# Patient Record
Sex: Female | Born: 1985 | Race: Black or African American | Hispanic: No | Marital: Married | Smoking: Never smoker
Health system: Southern US, Community
[De-identification: ages and names within clinical notes are randomized; demographics above are authoritative.]

## PROBLEM LIST (undated history)

## (undated) DIAGNOSIS — Z789 Other specified health status: Secondary | ICD-10-CM

## (undated) HISTORY — PX: NO PAST SURGERIES: SHX2092

---
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
## 2003-10-01 ENCOUNTER — Emergency Department (HOSPITAL_COMMUNITY): Admission: EM | Admit: 2003-10-01 | Discharge: 2003-10-01 | Payer: Self-pay | Admitting: Emergency Medicine

## 2003-10-12 ENCOUNTER — Inpatient Hospital Stay (HOSPITAL_COMMUNITY): Admission: AD | Admit: 2003-10-12 | Discharge: 2003-10-12 | Payer: Self-pay | Admitting: *Deleted

## 2004-04-12 ENCOUNTER — Inpatient Hospital Stay (HOSPITAL_COMMUNITY): Admission: AD | Admit: 2004-04-12 | Discharge: 2004-04-13 | Payer: Self-pay | Admitting: Obstetrics and Gynecology

## 2004-06-28 ENCOUNTER — Ambulatory Visit (HOSPITAL_COMMUNITY): Admission: RE | Admit: 2004-06-28 | Discharge: 2004-06-28 | Payer: Self-pay | Admitting: Cardiology

## 2004-09-25 ENCOUNTER — Inpatient Hospital Stay (HOSPITAL_COMMUNITY): Admission: AD | Admit: 2004-09-25 | Discharge: 2004-09-25 | Payer: Self-pay | Admitting: *Deleted

## 2004-11-13 ENCOUNTER — Inpatient Hospital Stay (HOSPITAL_COMMUNITY): Admission: AD | Admit: 2004-11-13 | Discharge: 2004-11-13 | Payer: Self-pay | Admitting: Obstetrics

## 2004-11-14 ENCOUNTER — Inpatient Hospital Stay (HOSPITAL_COMMUNITY): Admission: AD | Admit: 2004-11-14 | Discharge: 2004-11-15 | Payer: Self-pay | Admitting: Obstetrics

## 2004-11-22 ENCOUNTER — Inpatient Hospital Stay (HOSPITAL_COMMUNITY): Admission: AD | Admit: 2004-11-22 | Discharge: 2004-11-23 | Payer: Self-pay | Admitting: Obstetrics

## 2004-12-01 ENCOUNTER — Inpatient Hospital Stay (HOSPITAL_COMMUNITY): Admission: AD | Admit: 2004-12-01 | Discharge: 2004-12-01 | Payer: Self-pay | Admitting: Obstetrics

## 2004-12-05 ENCOUNTER — Inpatient Hospital Stay (HOSPITAL_COMMUNITY): Admission: AD | Admit: 2004-12-05 | Discharge: 2004-12-05 | Payer: Self-pay | Admitting: Obstetrics

## 2004-12-08 ENCOUNTER — Inpatient Hospital Stay (HOSPITAL_COMMUNITY): Admission: AD | Admit: 2004-12-08 | Discharge: 2004-12-11 | Payer: Self-pay | Admitting: Obstetrics

## 2004-12-08 ENCOUNTER — Inpatient Hospital Stay (HOSPITAL_COMMUNITY): Admission: AD | Admit: 2004-12-08 | Discharge: 2004-12-08 | Payer: Self-pay | Admitting: Obstetrics

## 2005-12-16 ENCOUNTER — Emergency Department (HOSPITAL_COMMUNITY): Admission: EM | Admit: 2005-12-16 | Discharge: 2005-12-16 | Payer: Self-pay | Admitting: Emergency Medicine

## 2006-11-08 ENCOUNTER — Ambulatory Visit: Payer: Self-pay | Admitting: Family Medicine

## 2006-11-11 ENCOUNTER — Ambulatory Visit: Payer: Self-pay | Admitting: *Deleted

## 2007-02-17 ENCOUNTER — Emergency Department (HOSPITAL_COMMUNITY): Admission: EM | Admit: 2007-02-17 | Discharge: 2007-02-17 | Payer: Self-pay | Admitting: Family Medicine

## 2007-03-13 ENCOUNTER — Emergency Department (HOSPITAL_COMMUNITY): Admission: EM | Admit: 2007-03-13 | Discharge: 2007-03-13 | Payer: Self-pay | Admitting: Emergency Medicine

## 2007-03-26 ENCOUNTER — Ambulatory Visit: Payer: Self-pay | Admitting: Internal Medicine

## 2007-04-14 ENCOUNTER — Ambulatory Visit (HOSPITAL_COMMUNITY): Admission: RE | Admit: 2007-04-14 | Discharge: 2007-04-14 | Payer: Self-pay | Admitting: Internal Medicine

## 2007-05-26 ENCOUNTER — Emergency Department (HOSPITAL_COMMUNITY): Admission: EM | Admit: 2007-05-26 | Discharge: 2007-05-26 | Payer: Self-pay | Admitting: Emergency Medicine

## 2007-05-30 ENCOUNTER — Ambulatory Visit (HOSPITAL_COMMUNITY): Admission: RE | Admit: 2007-05-30 | Discharge: 2007-05-30 | Payer: Self-pay | Admitting: Emergency Medicine

## 2008-01-11 IMAGING — US US ABDOMEN COMPLETE
1 series · 14 of 25 positions shown · non-contrast
Comparison: 09/15/2001

CLINICAL DATA: Vomiting. Abdominal pain. Back pain.

ABDOMEN ULTRASOUND
TECHNIQUE: Complete abdominal ultrasound examination was performed including
evaluation of the liver, gallbladder, bile ducts, pancreas, kidneys, spleen,
IVC, and abdominal aorta.

[Series 1: unknown · 0.33mm/px · 14 of 62 slices shown]
[im 1/62]
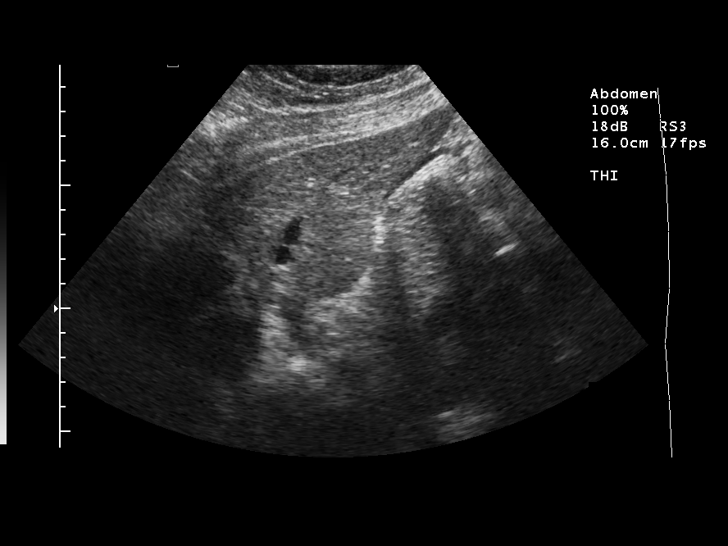
[im 6/62]
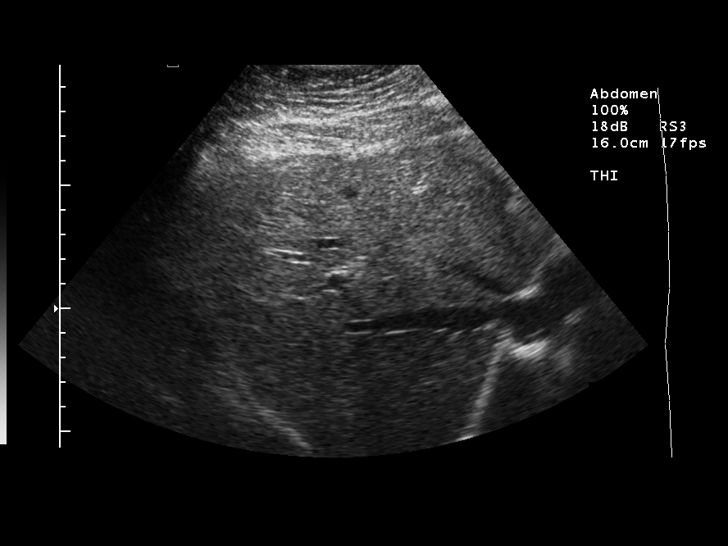
[im 11/62]
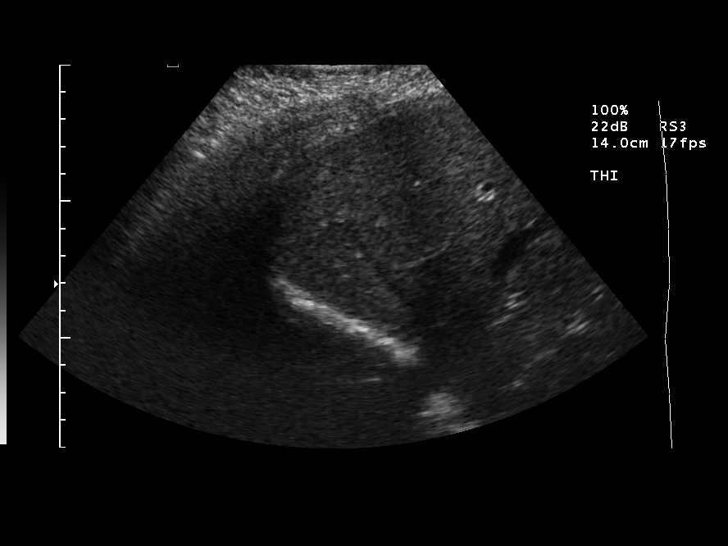
[im 16/62]
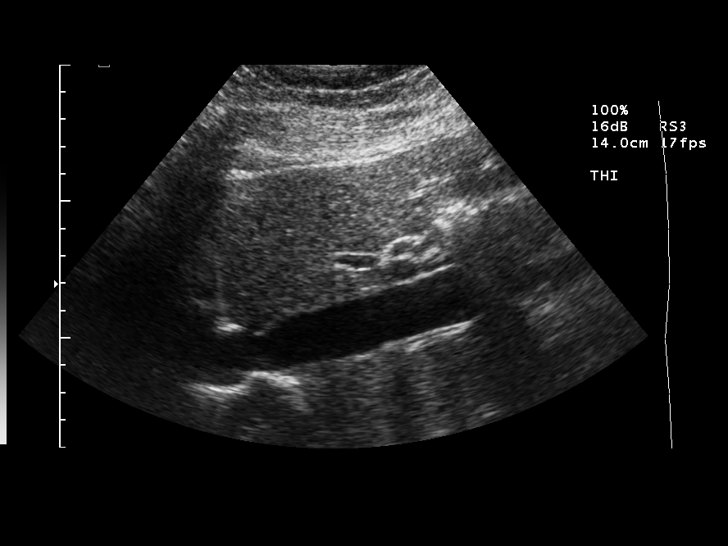
[im 21/62]
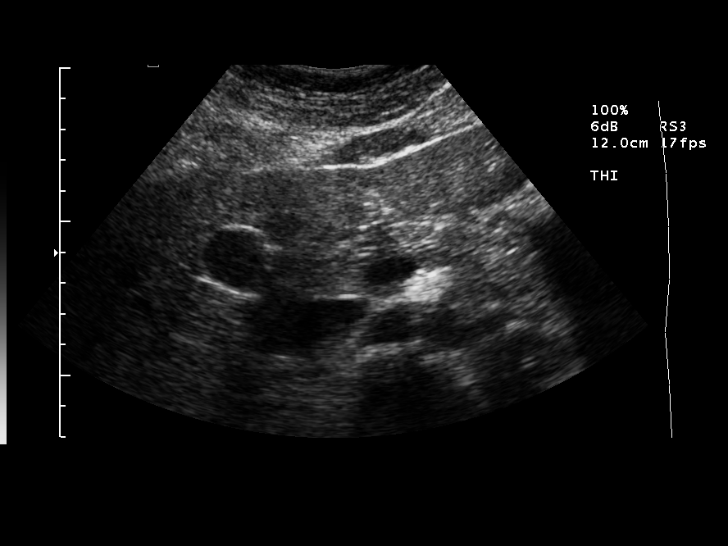
[im 23/62]
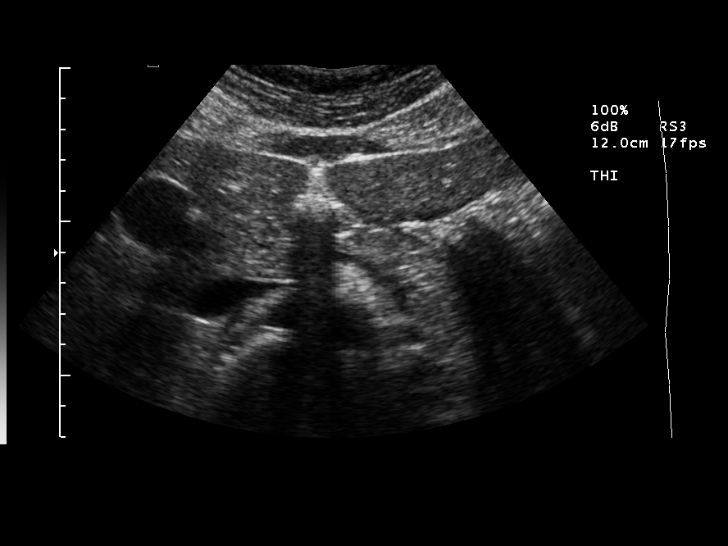
[im 28/62]
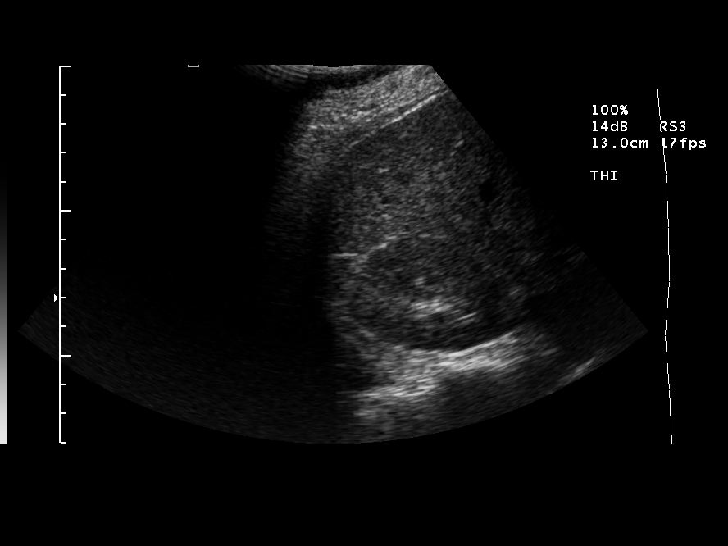
[im 34/62]
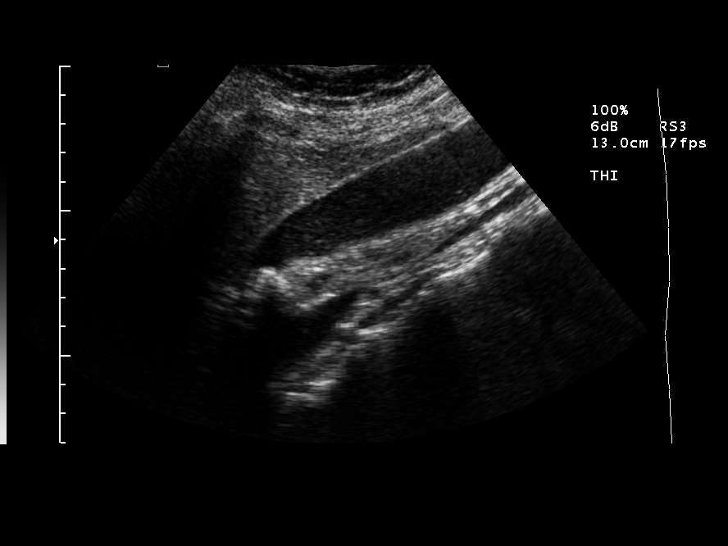
[im 39/62]
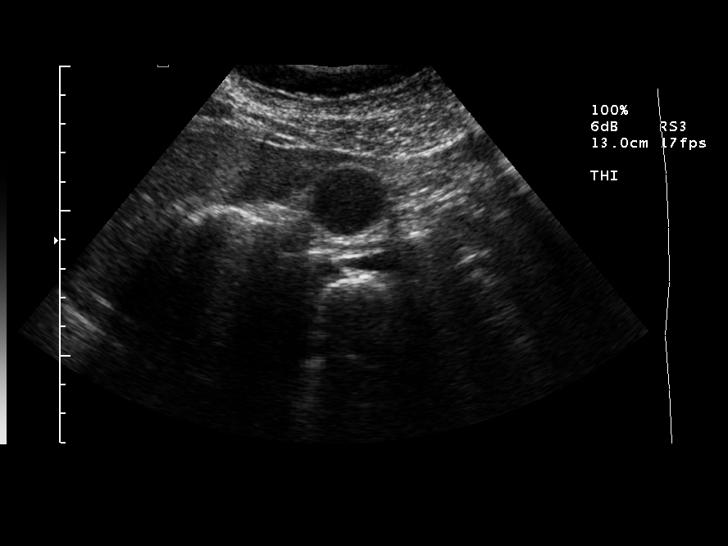
[im 41/62]
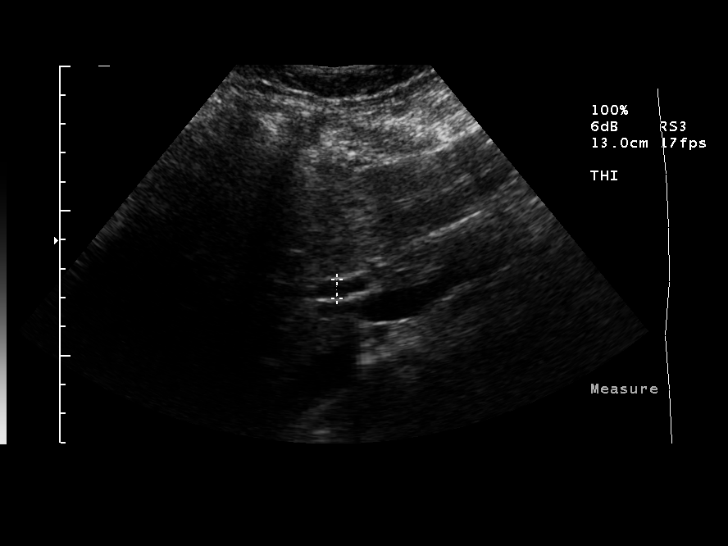
[im 46/62]
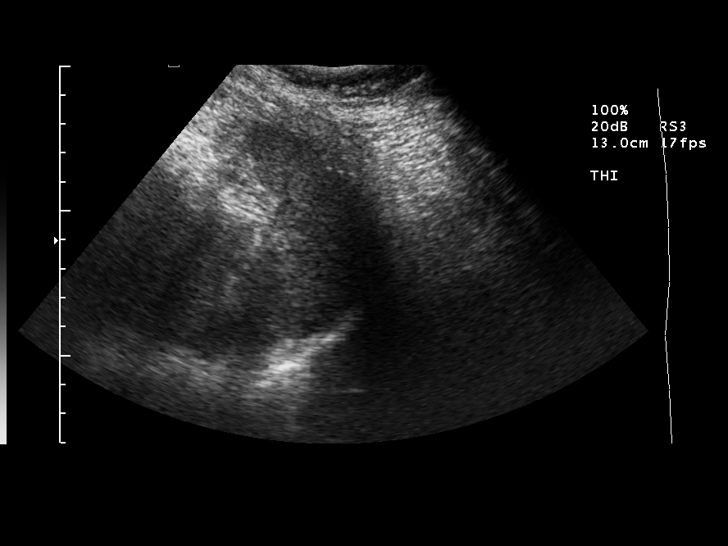
[im 51/62]
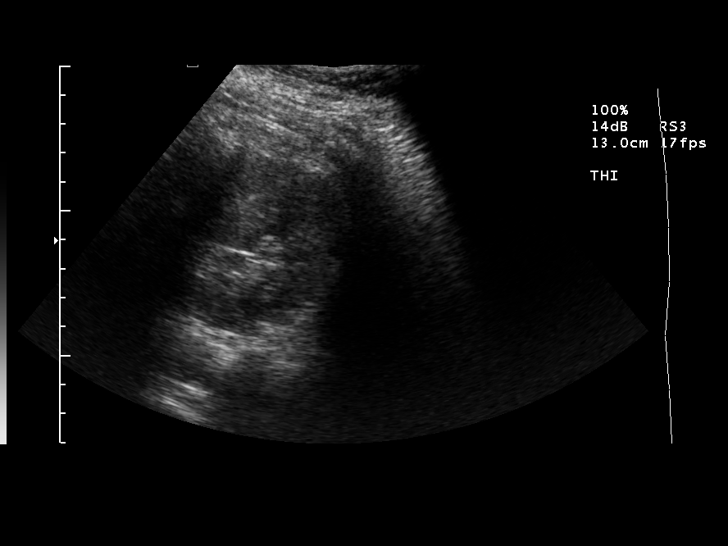
[im 56/62]
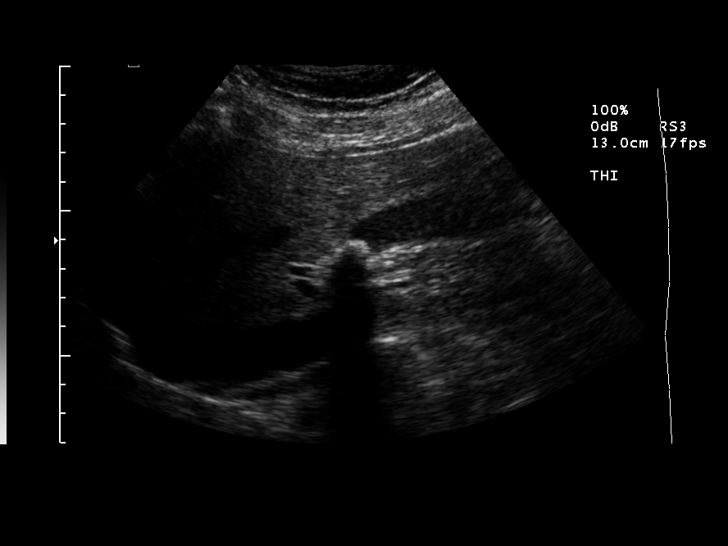
[im 62/62]
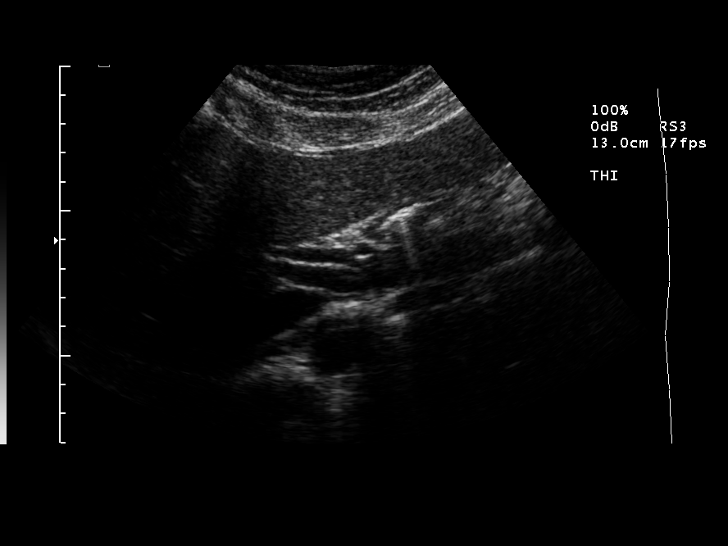

[14 of 25 positions shown; findings below may reference images not displayed]

FINDINGS: A 1.6 cm gallstone lodged in the neck the gallbladder. Sonographic
Murphy's sign is absent. No gallbladder wall thickening or pericholecystic
fluid. There is diffuse sludge in the gallbladder.

Common bile duct is dilated, at 6 mm. No directly visualize choledocholithiasis.

No intrahepatic bile duct dilatation is identified. The liver appears
unremarkable.

The IVC, pancreas, spleen, and abdominal aorta appear normal right kidney
measures 10.5 cm in greatest length in the left kidney measures 10.3 cm in
greatest length. No renal stones, masses, hydronephrosis.

IMPRESSION

1. Cholelithiasis, with a 1.6 cm gallstone lodged in the neck of the
gallbladder. Gallbladder sludge is present. 
2. Mild common bile duct dilatation, without directly visualized
choledocholithiasis.

## 2008-01-14 ENCOUNTER — Emergency Department (HOSPITAL_COMMUNITY): Admission: EM | Admit: 2008-01-14 | Discharge: 2008-01-14 | Payer: Self-pay | Admitting: Family Medicine

## 2008-05-18 ENCOUNTER — Emergency Department (HOSPITAL_COMMUNITY): Admission: EM | Admit: 2008-05-18 | Discharge: 2008-05-18 | Payer: Self-pay | Admitting: Emergency Medicine

## 2008-06-10 ENCOUNTER — Emergency Department (HOSPITAL_COMMUNITY): Admission: EM | Admit: 2008-06-10 | Discharge: 2008-06-11 | Payer: Self-pay | Admitting: Emergency Medicine

## 2010-05-01 ENCOUNTER — Inpatient Hospital Stay (HOSPITAL_COMMUNITY)
Admission: AD | Admit: 2010-05-01 | Discharge: 2010-05-01 | Payer: Self-pay | Source: Home / Self Care | Admitting: Family Medicine

## 2010-05-18 ENCOUNTER — Ambulatory Visit: Payer: Self-pay | Admitting: Obstetrics & Gynecology

## 2010-06-07 ENCOUNTER — Ambulatory Visit: Admit: 2010-06-07 | Payer: Self-pay | Admitting: Obstetrics and Gynecology

## 2010-06-14 ENCOUNTER — Emergency Department (HOSPITAL_COMMUNITY)
Admission: EM | Admit: 2010-06-14 | Discharge: 2010-06-14 | Payer: Self-pay | Source: Home / Self Care | Admitting: Emergency Medicine

## 2010-06-21 ENCOUNTER — Ambulatory Visit: Admit: 2010-06-21 | Payer: Self-pay | Admitting: Obstetrics and Gynecology

## 2010-08-07 LAB — CBC
HCT: 28.3 % — ABNORMAL LOW (ref 36.0–46.0)
MCHC: 33.6 g/dL (ref 30.0–36.0)
Platelets: 267 10*3/uL (ref 150–400)
RDW: 13.4 % (ref 11.5–15.5)
WBC: 9.2 10*3/uL (ref 4.0–10.5)

## 2010-08-07 LAB — GC/CHLAMYDIA PROBE AMP, GENITAL: Chlamydia, DNA Probe: NEGATIVE

## 2010-08-07 LAB — DIFFERENTIAL
Basophils Absolute: 0 10*3/uL (ref 0.0–0.1)
Basophils Relative: 1 % (ref 0–1)
Eosinophils Relative: 3 % (ref 0–5)
Lymphocytes Relative: 31 % (ref 12–46)
Monocytes Absolute: 0.7 10*3/uL (ref 0.1–1.0)
Neutro Abs: 5.3 10*3/uL (ref 1.7–7.7)

## 2010-08-07 LAB — WET PREP, GENITAL
Trich, Wet Prep: NONE SEEN
Yeast Wet Prep HPF POC: NONE SEEN

## 2010-09-13 ENCOUNTER — Inpatient Hospital Stay (INDEPENDENT_AMBULATORY_CARE_PROVIDER_SITE_OTHER)
Admission: RE | Admit: 2010-09-13 | Discharge: 2010-09-13 | Disposition: A | Discharge status: Home / Self Care | Payer: Self-pay | Source: Ambulatory Visit | Attending: Family Medicine | Admitting: Family Medicine

## 2010-09-13 DIAGNOSIS — R197 Diarrhea, unspecified: Secondary | ICD-10-CM

## 2010-09-13 DIAGNOSIS — R112 Nausea with vomiting, unspecified: Secondary | ICD-10-CM

## 2010-09-13 LAB — POCT URINALYSIS DIP (DEVICE)
Bilirubin Urine: NEGATIVE
Nitrite: NEGATIVE
Protein, ur: NEGATIVE mg/dL
pH: 6 (ref 5.0–8.0)

## 2010-09-13 LAB — POCT PREGNANCY, URINE: Preg Test, Ur: NEGATIVE

## 2010-10-13 NOTE — Op Note (Signed)
[facility]  Patient:    [name], [name] Visit Number: [id number] MRN: [id number]          Service Type: Attending:  Kathreen Cosier, M.D. Dictated by:   Kathreen Cosier, M.D. Proc. Date: 08/19/01                             Operative Report  PREOPERATIVE DIAGNOSES:       Pushing for two and a half hours to a +3 station, vertex.  SURGEON:                      Kathreen Cosier, M.D.  PROCEDURE:                    During contraction the patient pushed until the vertex was a +3 station.  Using 1% Xylocaine perineum was injected and a midline episiotomy performed.  Vacuum was applied through one contraction and one push and the vertex delivered from the LOA position.  The remainder of the baby was easily delivered with no problem.  Apgar score 8 and 9 from the LOA position, female.  There was no pop-off during the pushing.  There was a partial third degree.  Using 2-0 Vicryl suture the sphincter muscle was grasped bilaterally and closed in two layers.  Then the episiotomy was repaired with 2-0 Vicryl in the usual manner.  Uterus was well contracted. Lochia normal.  Patient tolerated procedure well. Dictated by:   Kathreen Cosier, M.D. Attending:  Kathreen Cosier, M.D. DD:  08/19/01 TD:  08/20/01 Job: [id number]

## 2010-12-13 IMAGING — US US TRANSVAGINAL NON-OB
1 series · 13 of 25 positions shown · non-contrast
Comparison: None

CLINICAL DATA: Vaginal bleeding.



[Series 1: us transvaginal non-ob · 0.25mm/px · 13 of 46 slices shown]
[im 1/46]
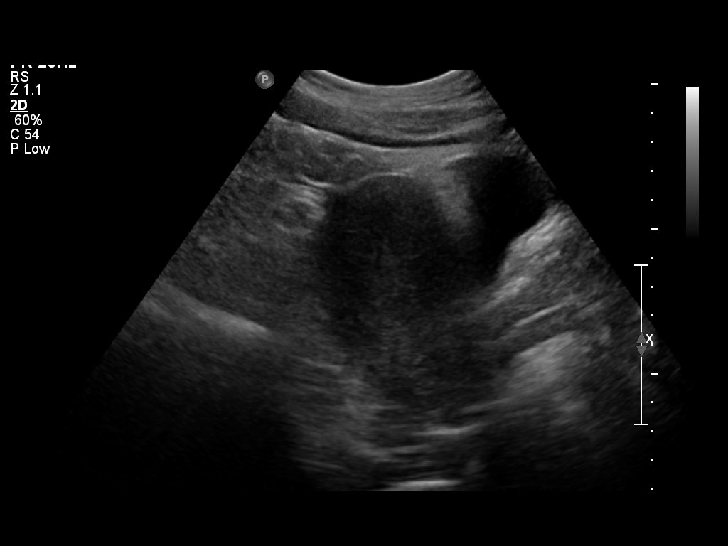
[im 4/46]
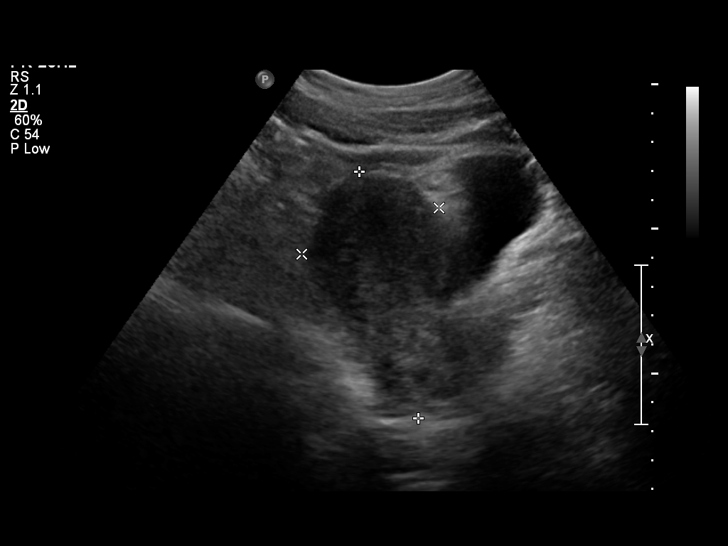
[im 8/46]
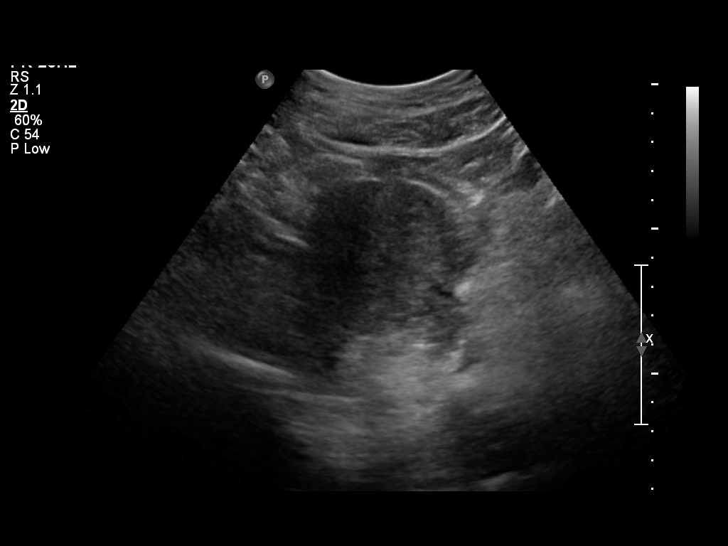
[im 12/46]
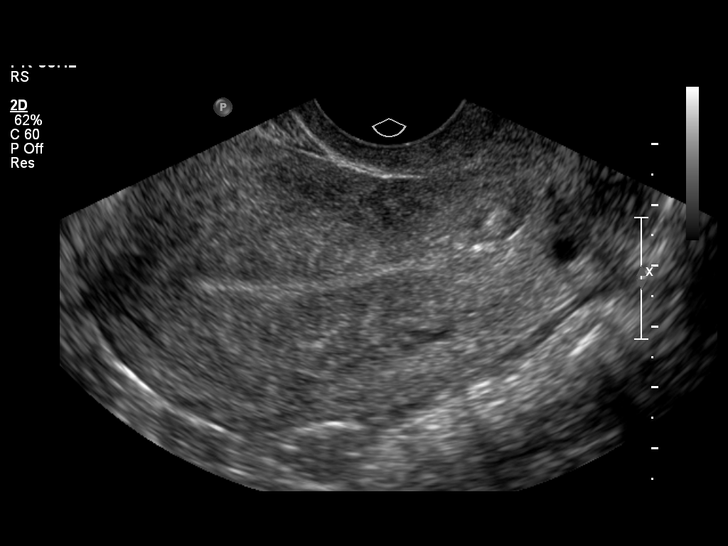
[im 16/46]
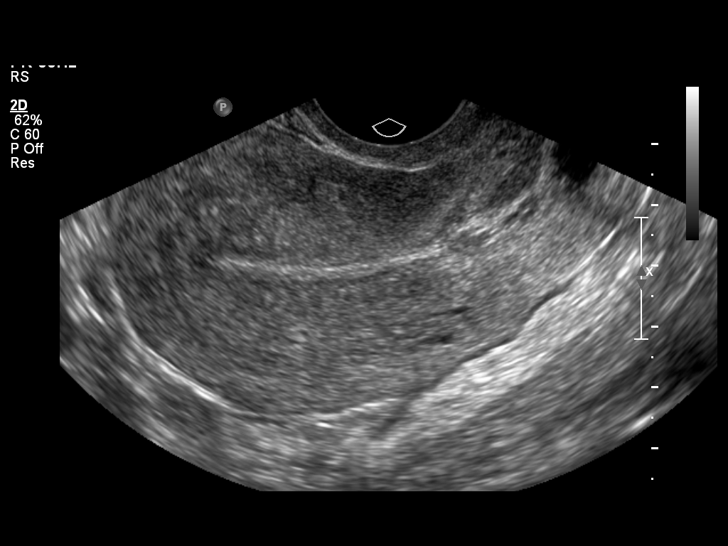
[im 19/46]
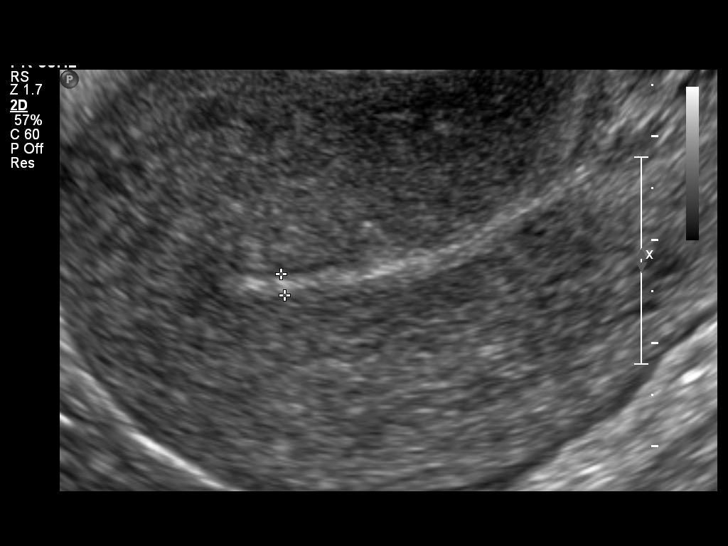
[im 23/46]
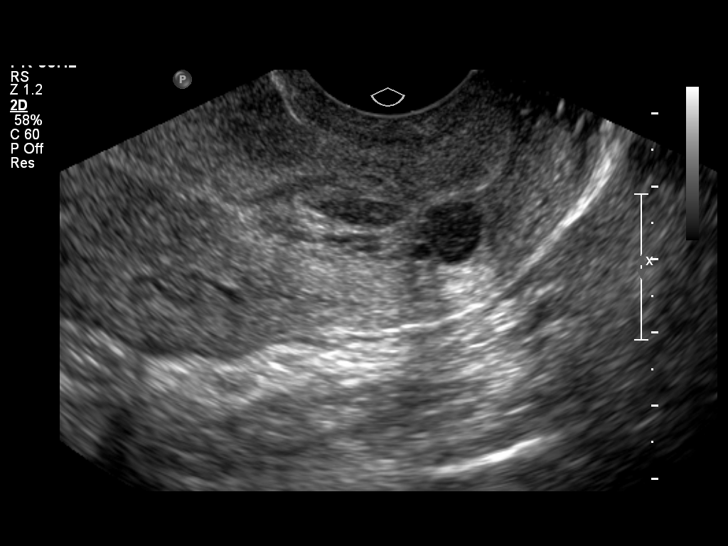
[im 27/46]
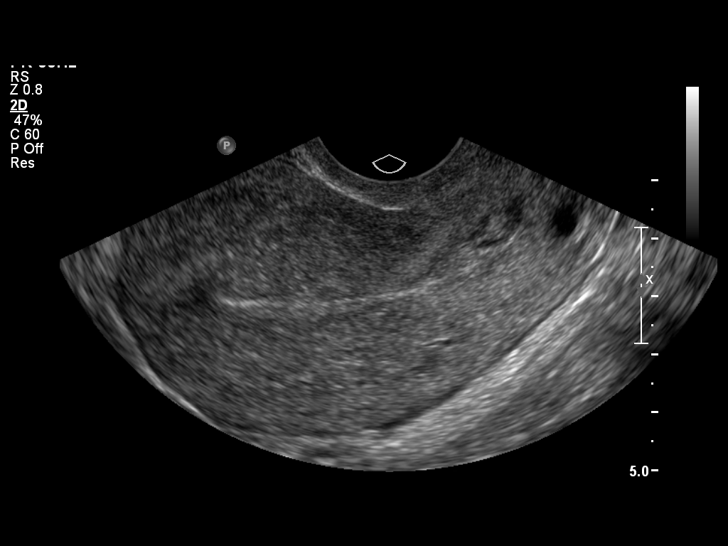
[im 31/46]
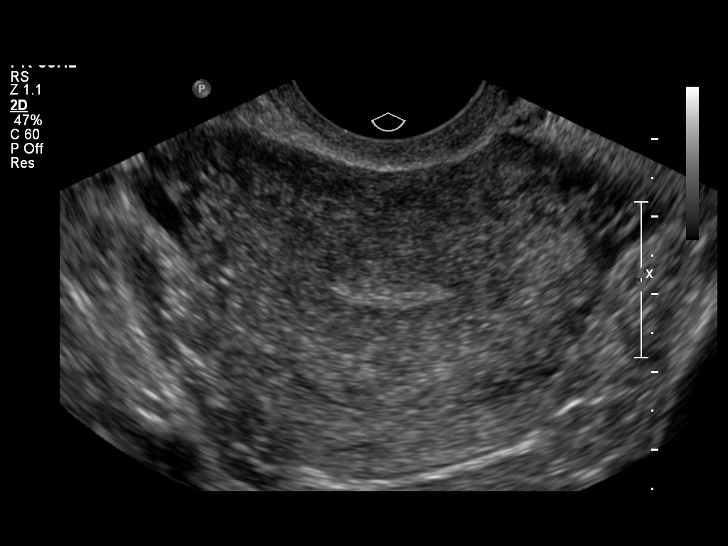
[im 34/46]
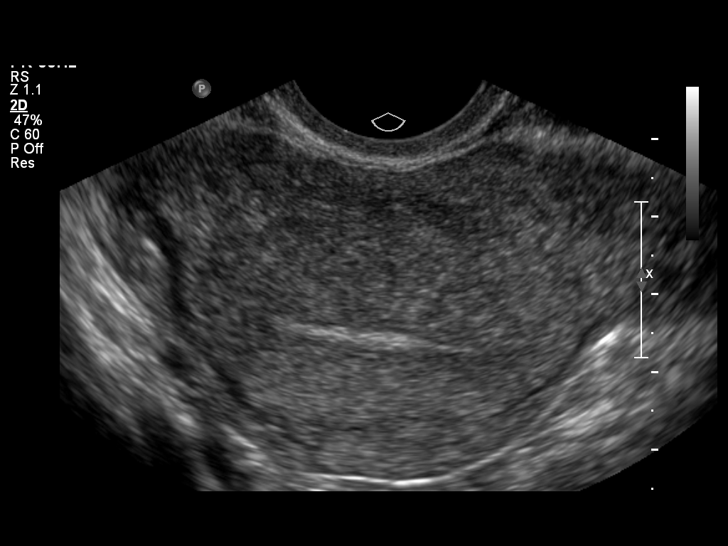
[im 38/46]
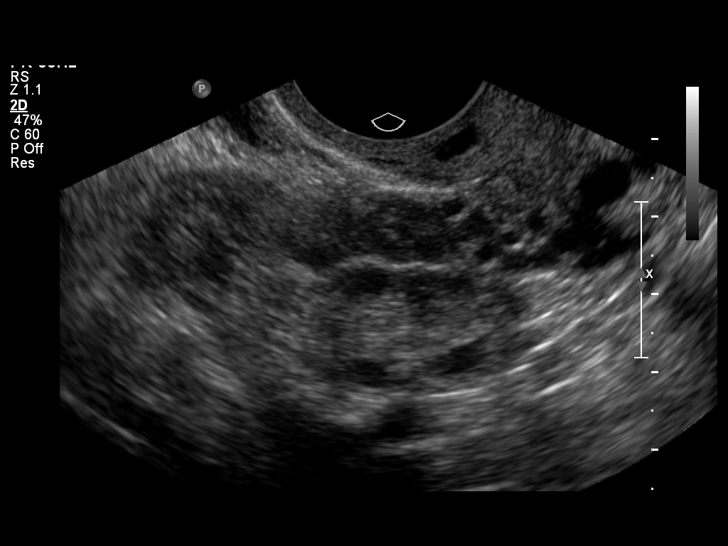
[im 42/46]
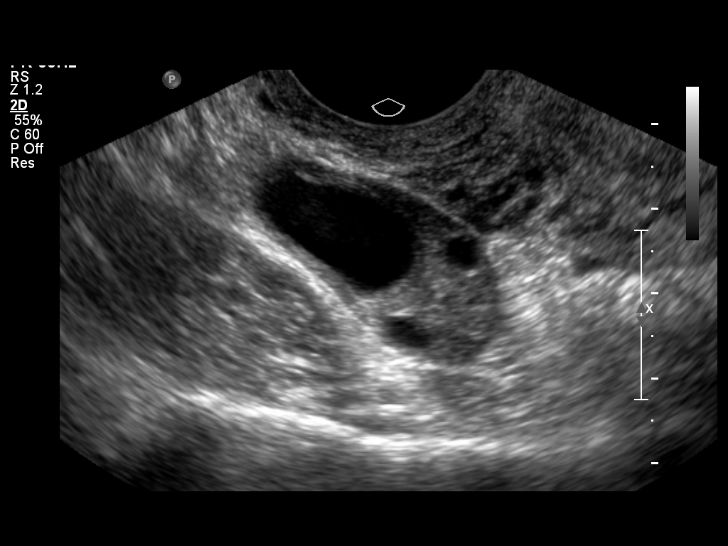
[im 46/46]
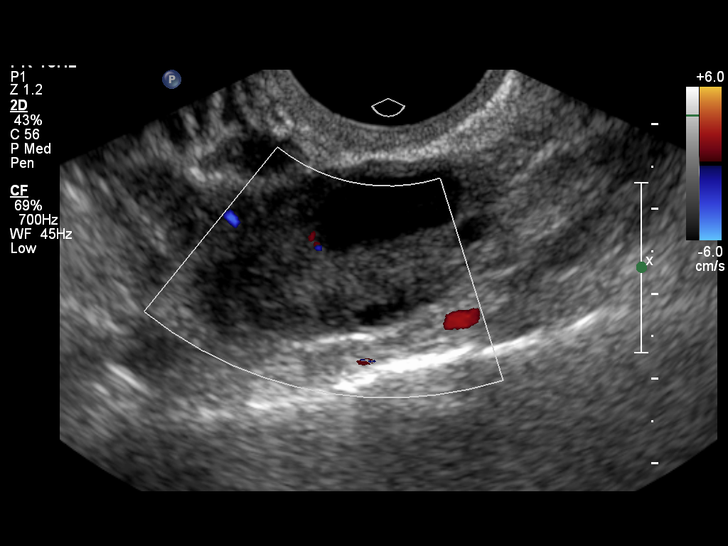

[13 of 25 positions shown; findings below may reference images not displayed]

FINDINGS: Uterus measures 8.6 x 4.9 x 5.1 cm, with Nabothian cyst shown in
the cervix.

Endometrium measures 2 mm in much of the uterus, but has a
thickened and somewhat irregular appearance in the lower uterine
segment near the margin of the cervix, where there is low
peripheral echogenicity and high irregular central echogenicity
along the endometrial canal as shown on image 14.  This could
simply represent a Nabothian cyst distorting the endometrial canal
in this vicinity, but I cannot exclude a polyp or subserosal
fibroid.

Right Ovary measures 4.5 x 2.0 x 1.9 cm and contains a simple
appearing cyst measuring 1.9 by 1.0 x 2.0 cm.

Left Ovary measures 3.0 x 1.6 x 1.5 cm and appears normal.

Other Findings:  No free pelvic fluid identified.
IMPRESSION: 1.  There is some distortion and irregularity of the endometrium in
the lower uterine segment adjacent to the cervix, with low
echogenicity structures along the margin of this area of
endometrial irregularity.  Although this could simply represent
distortion of the endometrium by prominent Nabothian cysts, I
cannot exclude a fibroid or polyp in this vicinity as a cause for
the appearance shown on image 21, and sonohysterography may be
warranted.

## 2011-03-02 LAB — COMPREHENSIVE METABOLIC PANEL
ALT: 18
AST: 18
Albumin: 4
Alkaline Phosphatase: 40
Chloride: 105
Potassium: 3.4 — ABNORMAL LOW
Sodium: 139
Total Protein: 6.8

## 2011-03-02 LAB — DIFFERENTIAL
Basophils Relative: 0
Eosinophils Absolute: 0.3
Eosinophils Relative: 3
Monocytes Absolute: 0.7
Monocytes Relative: 7

## 2011-03-02 LAB — CBC
Platelets: 243
RDW: 13.5
WBC: 10.5

## 2015-09-19 ENCOUNTER — Inpatient Hospital Stay (HOSPITAL_COMMUNITY)
Admission: AD | Admit: 2015-09-19 | Discharge: 2015-09-19 | Disposition: A | Discharge status: Home / Self Care | Payer: Self-pay | Source: Ambulatory Visit | Attending: Family Medicine | Admitting: Family Medicine

## 2015-09-19 ENCOUNTER — Inpatient Hospital Stay (HOSPITAL_COMMUNITY): Payer: Self-pay

## 2015-09-19 ENCOUNTER — Encounter (HOSPITAL_COMMUNITY): Payer: Self-pay | Admitting: *Deleted

## 2015-09-19 DIAGNOSIS — B373 Candidiasis of vulva and vagina: Secondary | ICD-10-CM

## 2015-09-19 DIAGNOSIS — B3731 Acute candidiasis of vulva and vagina: Secondary | ICD-10-CM

## 2015-09-19 DIAGNOSIS — A499 Bacterial infection, unspecified: Secondary | ICD-10-CM

## 2015-09-19 DIAGNOSIS — O26891 Other specified pregnancy related conditions, first trimester: Secondary | ICD-10-CM | POA: Insufficient documentation

## 2015-09-19 DIAGNOSIS — O209 Hemorrhage in early pregnancy, unspecified: Secondary | ICD-10-CM

## 2015-09-19 DIAGNOSIS — N760 Acute vaginitis: Secondary | ICD-10-CM

## 2015-09-19 DIAGNOSIS — B9689 Other specified bacterial agents as the cause of diseases classified elsewhere: Secondary | ICD-10-CM

## 2015-09-19 DIAGNOSIS — Z3490 Encounter for supervision of normal pregnancy, unspecified, unspecified trimester: Secondary | ICD-10-CM

## 2015-09-19 DIAGNOSIS — Z3A01 Less than 8 weeks gestation of pregnancy: Secondary | ICD-10-CM | POA: Insufficient documentation

## 2015-09-19 DIAGNOSIS — R1084 Generalized abdominal pain: Secondary | ICD-10-CM | POA: Insufficient documentation

## 2015-09-19 HISTORY — DX: Other specified health status: Z78.9

## 2015-09-19 LAB — URINALYSIS, ROUTINE W REFLEX MICROSCOPIC
BILIRUBIN URINE: NEGATIVE
Glucose, UA: NEGATIVE mg/dL
KETONES UR: NEGATIVE mg/dL
LEUKOCYTES UA: NEGATIVE
NITRITE: NEGATIVE
PROTEIN: NEGATIVE mg/dL
Specific Gravity, Urine: 1.025 (ref 1.005–1.030)
pH: 5.5 (ref 5.0–8.0)

## 2015-09-19 LAB — URINE MICROSCOPIC-ADD ON

## 2015-09-19 LAB — WET PREP, GENITAL
SPERM: NONE SEEN
Trich, Wet Prep: NONE SEEN

## 2015-09-19 LAB — ABO/RH: ABO/RH(D): A POS

## 2015-09-19 LAB — CBC
HCT: 34.4 % — ABNORMAL LOW (ref 36.0–46.0)
Hemoglobin: 11.8 g/dL — ABNORMAL LOW (ref 12.0–15.0)
MCH: 30.3 pg (ref 26.0–34.0)
MCHC: 34.3 g/dL (ref 30.0–36.0)
MCV: 88.4 fL (ref 78.0–100.0)
PLATELETS: 285 10*3/uL (ref 150–400)
RBC: 3.89 MIL/uL (ref 3.87–5.11)
RDW: 12.9 % (ref 11.5–15.5)
WBC: 9.7 10*3/uL (ref 4.0–10.5)

## 2015-09-19 LAB — POCT PREGNANCY, URINE: PREG TEST UR: POSITIVE — AB

## 2015-09-19 LAB — HCG, QUANTITATIVE, PREGNANCY: HCG, BETA CHAIN, QUANT, S: 5544 m[IU]/mL — AB (ref <5)

## 2015-09-19 MED ORDER — METRONIDAZOLE 500 MG PO TABS: 500.0000 mg | ORAL_TABLET | Freq: Two times a day (BID) | ORAL | Status: DC

## 2015-09-19 MED ORDER — FLUCONAZOLE 200 MG PO TABS: 200.0000 mg | ORAL_TABLET | Freq: Every day | ORAL | Status: AC

## 2015-09-19 NOTE — MAU Provider Note (Signed)
History   [id number] @ apx 6.5 wks in with c/o generalized abd pain for almost three weeks. States pain is 5/10.  CSN: [id number]  Arrival date & time 09/19/15  1243   First Provider Initiated Contact with Patient 09/19/15 1430      Chief Complaint  Patient presents with  . Abdominal Pain    HPI  History reviewed. No pertinent past medical history.  No past surgical history on file.  History reviewed. No pertinent family history.  Social History  Substance Use Topics  . Smoking status: None  . Smokeless tobacco: None  . Alcohol Use: None    OB History    Gravida Para Term Preterm AB TAB SAB Ectopic Multiple Living   6 2 2  3     2       Review of Systems  Constitutional: Negative.   HENT: Negative.   Eyes: Negative.   Respiratory: Negative.   Cardiovascular: Negative.   Gastrointestinal: Positive for abdominal pain.  Endocrine: Negative.   Genitourinary: Negative.   Musculoskeletal: Negative.   Skin: Negative.   Allergic/Immunologic: Negative.   Neurological: Negative.   Hematological: Negative.   Psychiatric/Behavioral: Negative.     Allergies  Review of patient's allergies indicates not on file.  Home Medications  No current outpatient prescriptions on file.  BP 139/88 mmHg  Pulse 74  Temp(Src) 98.2 F (36.8 C) (Oral)  Resp 18  Ht 5' 4.5" (1.638 m)  Wt 185 lb 9.6 oz (84.188 kg)  BMI 31.38 kg/m2  LMP 08/03/2015  Physical Exam  Constitutional: She is oriented to person, place, and time. She appears well-developed and well-nourished.  HENT:  Head: Normocephalic.  Eyes: Pupils are equal, round, and reactive to light.  Neck: Normal range of motion.  Cardiovascular: Normal rate, regular rhythm and normal heart sounds.   Pulmonary/Chest: Effort normal and breath sounds normal.  Abdominal: Soft. There is tenderness.  Genitourinary: Vagina normal and uterus normal.  Musculoskeletal: Normal range of motion.  Neurological: She is alert and oriented to  person, place, and time. She has normal reflexes.  Skin: Skin is warm and dry.  Psychiatric: She has a normal mood and affect. Her behavior is normal. Judgment and thought content normal.    MAU Course  Procedures (including critical care time)  Labs Reviewed  URINALYSIS, ROUTINE W REFLEX MICROSCOPIC (NOT AT [facility]) - Abnormal; Notable for the following:    Hgb urine dipstick TRACE (*)    All other components within normal limits  CBC - Abnormal; Notable for the following:    Hemoglobin 11.8 (*)    HCT 34.4 (*)    All other components within normal limits  URINE MICROSCOPIC-ADD ON - Abnormal; Notable for the following:    Squamous Epithelial / LPF 0-5 (*)    Bacteria, UA MANY (*)    All other components within normal limits  POCT PREGNANCY, URINE - Abnormal; Notable for the following:    Preg Test, Ur POSITIVE (*)    All other components within normal limits  HCG, QUANTITATIVE, PREGNANCY  ABO/RH   No results found.   No diagnosis found.    MDM  Generalized abd tenderness, no rebound. Wet prep yeast and BV, Chla and GC obtained. U/S shows viable IUP at 5.[redacted] wks gestation. Will d/c home

## 2015-09-19 NOTE — Discharge Instructions (Signed)
Bacterial Vaginosis Bacterial vaginosis is an infection of the vagina. It happens when too many germs (bacteria) grow in the vagina. Having this infection puts you at risk for getting other infections from sex. Treating this infection can help lower your risk for other infections, such as:   Chlamydia.  Gonorrhea.  HIV.  Herpes. HOME CARE  Take your medicine as told by your doctor.  Finish your medicine even if you start to feel better.  Tell your sex partner that you have an infection. They should see their doctor for treatment.  During treatment:  Avoid sex or use condoms correctly.  Do not douche.  Do not drink alcohol unless your doctor tells you it is ok.  Do not breastfeed unless your doctor tells you it is ok. GET HELP IF:  You are not getting better after 3 days of treatment.  You have more grey fluid (discharge) coming from your vagina than before.  You have more pain than before.  You have a fever. MAKE SURE YOU:   Understand these instructions.  Will watch your condition.  Will get help right away if you are not doing well or get worse.   This information is not intended to replace advice given to you by your health care provider. Make sure you discuss any questions you have with your health care provider.   Document Released: 02/21/2008 Document Revised: 06/04/2014 Document Reviewed: 12/24/2012 Elsevier Interactive Patient Education 2016 Elsevier Inc. Monilial Vaginitis Vaginitis in a soreness, swelling and redness (inflammation) of the vagina and vulva. Monilial vaginitis is not a sexually transmitted infection. CAUSES  Yeast vaginitis is caused by yeast (candida) that is normally found in your vagina. With a yeast infection, the candida has overgrown in number to a point that upsets the chemical balance. SYMPTOMS   White, thick vaginal discharge.  Swelling, itching, redness and irritation of the vagina and possibly the lips of the vagina  (vulva).  Burning or painful urination.  Painful intercourse. DIAGNOSIS  Things that may contribute to monilial vaginitis are:  Postmenopausal and virginal states.  Pregnancy.  Infections.  Being tired, sick or stressed, especially if you had monilial vaginitis in the past.  Diabetes. Good control will help lower the chance.  Birth control pills.  Tight fitting garments.  Using bubble bath, feminine sprays, douches or deodorant tampons.  Taking certain medications that kill germs (antibiotics).  Sporadic recurrence can occur if you become ill. TREATMENT  Your caregiver will give you medication.  There are several kinds of anti monilial vaginal creams and suppositories specific for monilial vaginitis. For recurrent yeast infections, use a suppository or cream in the vagina 2 times a week, or as directed.  Anti-monilial or steroid cream for the itching or irritation of the vulva may also be used. Get your caregiver's permission.  Painting the vagina with methylene blue solution may help if the monilial cream does not work.  Eating yogurt may help prevent monilial vaginitis. HOME CARE INSTRUCTIONS   Finish all medication as prescribed.  Do not have sex until treatment is completed or after your caregiver tells you it is okay.  Take warm sitz baths.  Do not douche.  Do not use tampons, especially scented ones.  Wear cotton underwear.  Avoid tight pants and panty hose.  Tell your sexual partner that you have a yeast infection. They should go to their caregiver if they have symptoms such as mild rash or itching.  Your sexual partner should be treated as well if   your infection is difficult to eliminate.  Practice safer sex. Use condoms.  Some vaginal medications cause latex condoms to fail. Vaginal medications that harm condoms are:  Cleocin cream.  Butoconazole (Femstat).  Terconazole (Terazol) vaginal suppository.  Miconazole (Monistat) (may be  purchased over the counter). SEEK MEDICAL CARE IF:   You have a temperature by mouth above 102 F (38.9 C).  The infection is getting worse after 2 days of treatment.  The infection is not getting better after 3 days of treatment.  You develop blisters in or around your vagina.  You develop vaginal bleeding, and it is not your menstrual period.  You have pain when you urinate.  You develop intestinal problems.  You have pain with sexual intercourse.   This information is not intended to replace advice given to you by your health care provider. Make sure you discuss any questions you have with your health care provider.   Document Released: 02/21/2005 Document Revised: 08/06/2011 Document Reviewed: 11/15/2014 Elsevier Interactive Patient Education 2016 Elsevier Inc.  

## 2015-09-19 NOTE — MAU Note (Signed)
Stomach hurts a lot.  Pain comes and goes, started about 3 wks ago. Worse than a cramp

## 2015-09-20 LAB — GC/CHLAMYDIA PROBE AMP (~~LOC~~) NOT AT ARMC
CHLAMYDIA, DNA PROBE: NEGATIVE
NEISSERIA GONORRHEA: NEGATIVE

## 2015-09-20 LAB — SYPHILIS: RPR W/REFLEX TO RPR TITER AND TREPONEMAL ANTIBODIES, TRADITIONAL SCREENING AND DIAGNOSIS ALGORITHM: RPR Ser Ql: NONREACTIVE

## 2015-10-02 ENCOUNTER — Inpatient Hospital Stay (HOSPITAL_COMMUNITY)
Admission: AD | Admit: 2015-10-02 | Discharge: 2015-10-02 | Disposition: A | Discharge status: Home / Self Care | Payer: Self-pay | Source: Ambulatory Visit | Attending: Obstetrics & Gynecology | Admitting: Obstetrics & Gynecology

## 2015-10-02 ENCOUNTER — Inpatient Hospital Stay (HOSPITAL_COMMUNITY): Payer: Self-pay

## 2015-10-02 ENCOUNTER — Encounter (HOSPITAL_COMMUNITY): Payer: Self-pay | Admitting: *Deleted

## 2015-10-02 DIAGNOSIS — O039 Complete or unspecified spontaneous abortion without complication: Secondary | ICD-10-CM | POA: Insufficient documentation

## 2015-10-02 DIAGNOSIS — Z3A01 Less than 8 weeks gestation of pregnancy: Secondary | ICD-10-CM | POA: Insufficient documentation

## 2015-10-02 LAB — URINALYSIS, ROUTINE W REFLEX MICROSCOPIC
Bilirubin Urine: NEGATIVE
GLUCOSE, UA: NEGATIVE mg/dL
KETONES UR: NEGATIVE mg/dL
LEUKOCYTES UA: NEGATIVE
NITRITE: NEGATIVE
PROTEIN: NEGATIVE mg/dL
Specific Gravity, Urine: 1.02 (ref 1.005–1.030)
pH: 6 (ref 5.0–8.0)

## 2015-10-02 LAB — URINE MICROSCOPIC-ADD ON
Bacteria, UA: NONE SEEN
WBC, UA: NONE SEEN WBC/hpf (ref 0–5)

## 2015-10-02 NOTE — MAU Provider Note (Signed)
History     CSN: [id number]  Arrival date & time 10/02/15  1122  First provider contact at 11:55     Chief Complaint  Patient presents with  . Vaginal Bleeding    HPI Comments: Tanya Stephens is a 30 y.o. [id number] at [redacted]w[redacted]d presenting with 2 day history of bleeding as heavy as a menstrual period. She reports clots but no tissue passed. Bleeding is bright red. Associated with suprapubic abdominal pain which has been present for weeks but has increased. Pain does not radiate. Denies urinary symptoms or back pain. Has not had intercourse since before her visit here 2 weeks ago. She was treated for BV and began taking Flagyl 1 week ago. GC chlamydia were negative.    Vaginal Bleeding The patient's pertinent negatives include no vaginal discharge. This is a new problem. The current episode started yesterday. The problem occurs constantly. The problem has been unchanged. The pain is mild. The problem affects both sides. She is pregnant. Associated symptoms include abdominal pain, frequency and vomiting. Pertinent negatives include no back pain, chills, dysuria or hematuria. No, her partner does not have an STD.      Past Medical History  Diagnosis Date  . Medical history non-contributory     Past Surgical History  Procedure Laterality Date  . No past surgeries      History reviewed. No pertinent family history.  Social History  Substance Use Topics  . Smoking status: Never Smoker   . Smokeless tobacco: Never Used  . Alcohol Use: No    OB History    Gravida Para Term Preterm AB TAB SAB Ectopic Multiple Living   Review of Systems  Constitutional: Positive for fatigue. Negative for chills.  Gastrointestinal: Positive for vomiting and abdominal pain. Negative for blood in stool and anal bleeding.       Pain is like menstrual cramps.  Genitourinary: Positive for frequency and vaginal bleeding. Negative for dysuria, hematuria, vaginal discharge and  difficulty urinating.  Musculoskeletal: Negative for back pain.  Neurological: Negative.   Psychiatric/Behavioral: Negative.     Allergies  Review of patient's allergies indicates no known allergies.  Home Medications  No current outpatient prescriptions on file.  BP 139/90 mmHg  Pulse 67  Temp(Src) 98.3 F (36.8 C) (Oral)  Resp 16  LMP 08/03/2015  Physical Exam  Constitutional: She is oriented to person, place, and time. She appears well-developed and well-nourished. No distress.  HENT:  Head: Normocephalic.  Neck: Neck supple. No thyromegaly present.  Cardiovascular: Normal rate.   Pulmonary/Chest: Effort normal.  Abdominal: Soft. There is no tenderness. There is no guarding.  Genitourinary: No vaginal discharge found.  NEFG. Vagina with moderate amount of dark blood. Swab from cervix with minimal active bleeding noted.cervix multiparous with no lesions noted. External os fingertip. Cervix thick. Uterus consistent with seven-week size.  Musculoskeletal: Normal range of motion.  Neurological: She is alert and oriented to person, place, and time.  Skin: Skin is warm and dry.  Psychiatric: She has a normal mood and affect. Her behavior is normal.    MAU Course  Procedures (including critical care time)  Labs Reviewed  URINALYSIS, ROUTINE W REFLEX MICROSCOPIC (NOT AT [facility]) - Abnormal; Notable for the following:    APPearance HAZY (*)    Hgb urine dipstick LARGE (*)    All other components within normal limits  URINE MICROSCOPIC-ADD ON - Abnormal; Notable for  the following:    Squamous Epithelial / LPF 0-5 (*)    All other components within normal limits   CLINICAL DATA: Patient with vaginal bleeding.  EXAM: TRANSVAGINAL OB ULTRASOUND  TECHNIQUE: Transvaginal ultrasound was performed for complete evaluation of the gestation as well as the maternal uterus, adnexal regions, and pelvic cul-de-sac.  COMPARISON: Pelvic ultrasound  09/19/2015  FINDINGS: Intrauterine gestational sac: Not present  Yolk sac: Not present  Embryo: Not present  Cardiac Activity: Not present  Maternal uterus/adnexae: The right ovary is normal. Left ovary is not visualized. No free fluid in the pelvis.  IMPRESSION: No intrauterine pregnancy is identified. No evidence to suggest retained products of conception.   Electronically Signed  By: Annia Beltrew Davis M.D.  On: 10/02/2015 13:50      MDM  Had documented viable IUP on 09/19/15; now uterus is empty and bleeding is small-moderate. Doubt any RPOC.  ASSESSMENT/PLAN:  SAB (spontaneous abortion) - Plan: Discharge patient, Discharge patient Borderline BP elevation  Discharge home with bleeding precautions   Medication List    STOP taking these medications        metroNIDAZOLE 500 MG tablet  Commonly known as:  FLAGYL      TAKE these medications        acetaminophen 325 MG tablet  Commonly known as:  TYLENOL  Take 1,300 mg by mouth every 6 (six) hours as needed for mild pain.     prenatal multivitamin Tabs tablet  Take 1 tablet by mouth daily at 12 noon.       Follow-up Information    [facility] In [facility] weeks.   Specialty:  Obstetrics and Gynecology   Why:  Someone from Clinic will call you with appt.   Contact information:   [address] [place] [id number] [phone]

## 2015-10-02 NOTE — MAU Note (Addendum)
Bleeding X 2 days. Read on google that a medication she was given has a "70% miscarriage rate."

## 2015-10-17 ENCOUNTER — Encounter: Payer: Self-pay | Admitting: Obstetrics and Gynecology

## 2015-10-17 NOTE — Progress Notes (Signed)
Patient ID: Tanya ForemanQuinisia D Woods, female   DOB: August 13, 1985, 30 y.o.   MRN: [id number] Patient no shoed for appointment. Discussed with provider, patient may reschedule if desired.

## 2016-05-02 IMAGING — US US OB TRANSVAGINAL
1 series · 15 of 28 positions shown · non-contrast
Comparison: None.

CLINICAL DATA: Abdominal pain.  Pregnancy.  First trimester.

EXAM:
OBSTETRIC <14 WK US AND TRANSVAGINAL OB US
TECHNIQUE: Both transabdominal and transvaginal ultrasound examinations were
performed for complete evaluation of the gestation as well as the
maternal uterus, adnexal regions, and pelvic cul-de-sac.
Transvaginal technique was performed to assess early pregnancy.

[Series 1: us ob transvaginal · 29 acquisitions, 15 frames shown]
[im 1/29]
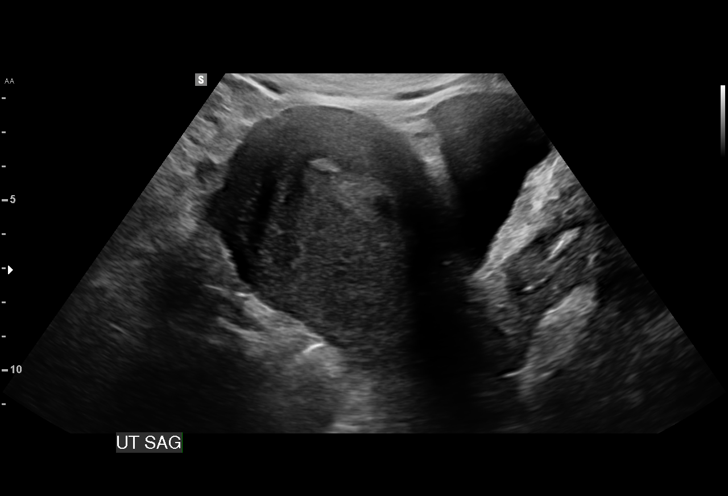
[im 3/29]
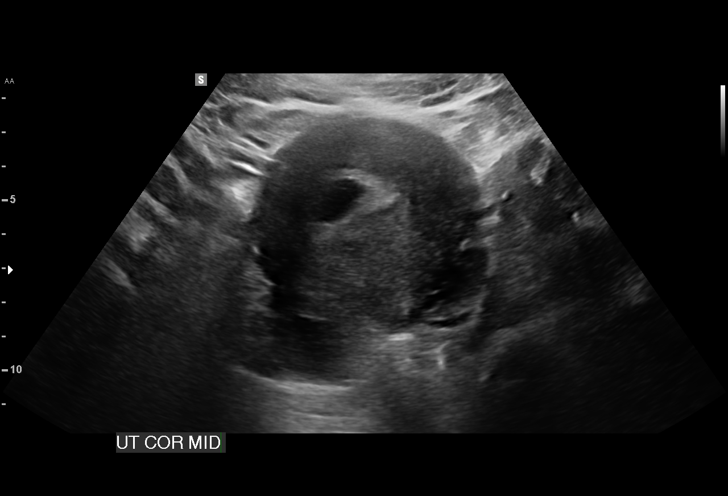
[im 5/29]
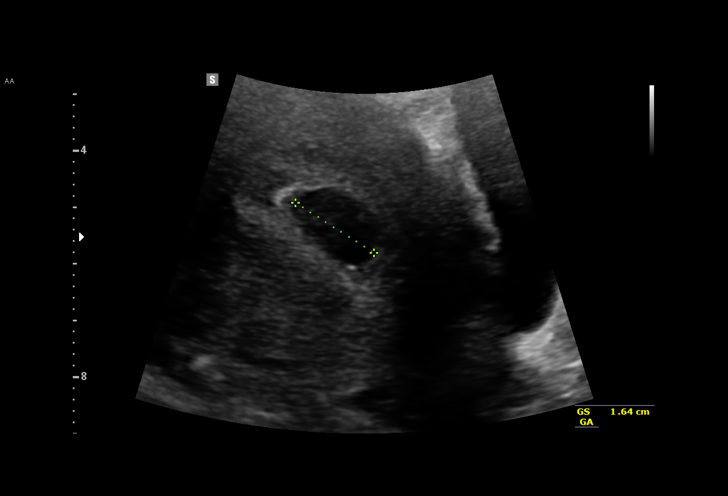
[im 7/29]
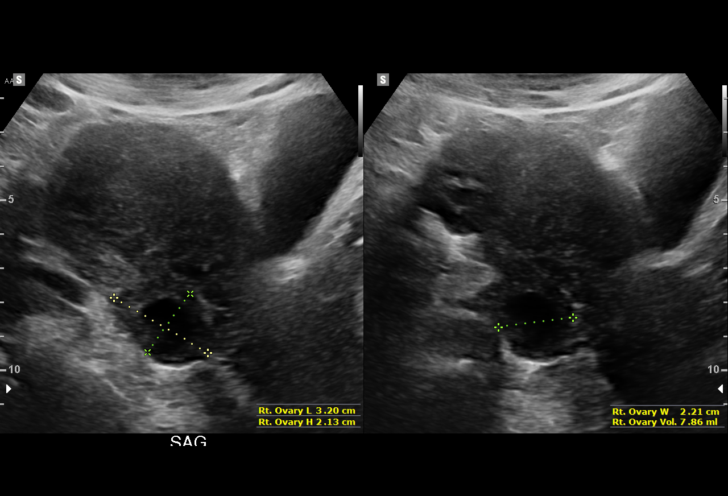
[im 9/29]
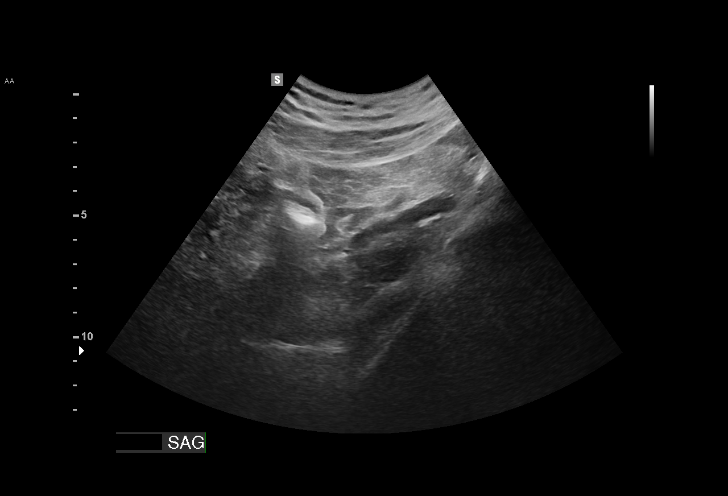
[im 11/29]
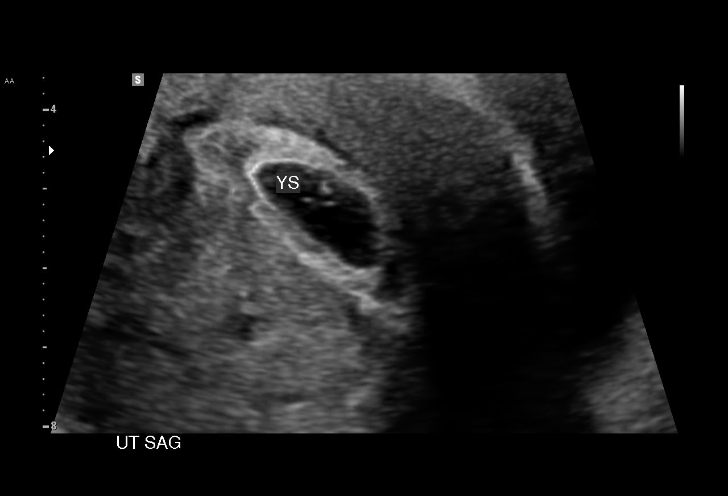
[im 13/29]
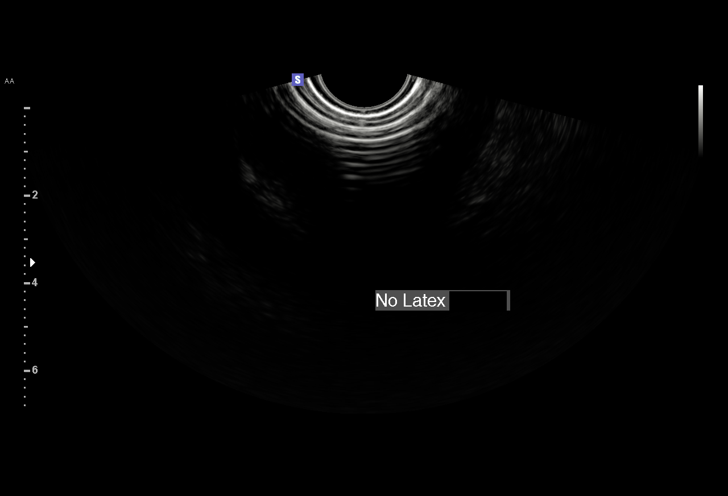
[im 15/29]
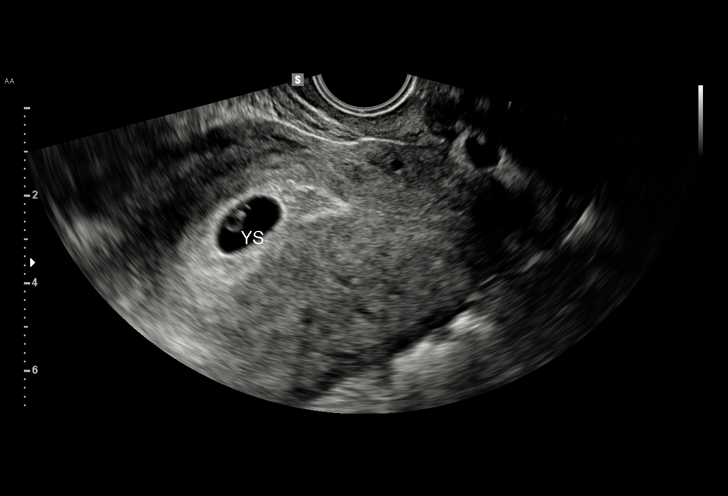
[im 16/29]
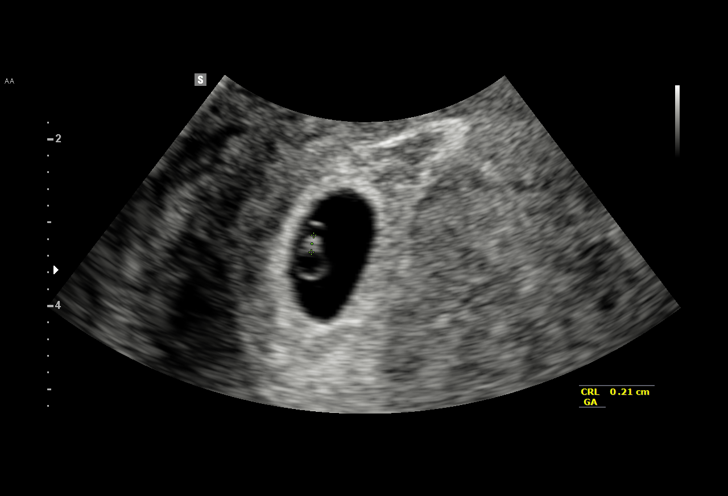
[im 18/29]
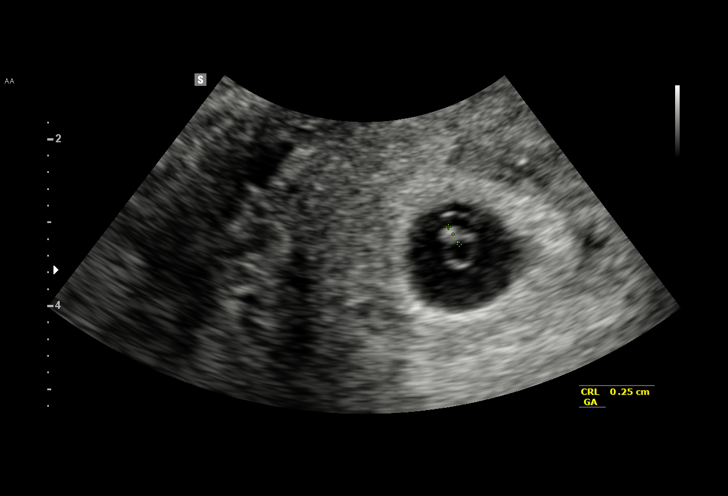
[im 20/29]
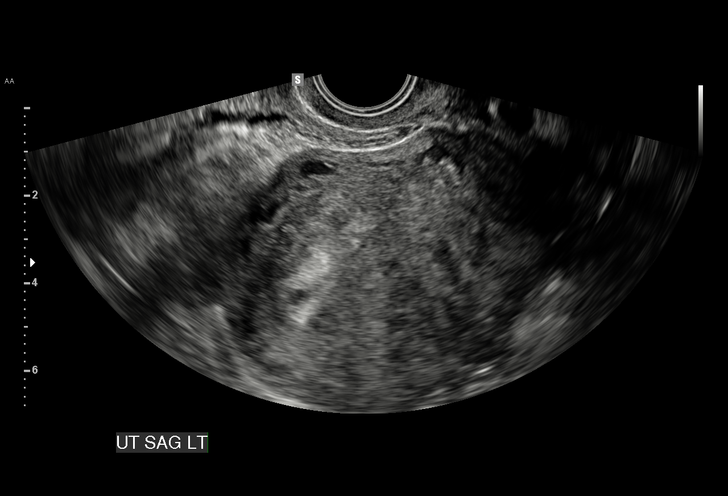
[im 22/29]
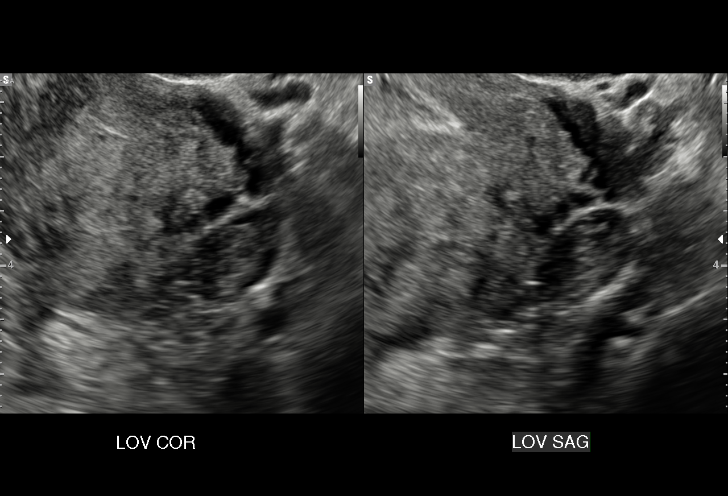
[im 24/29]
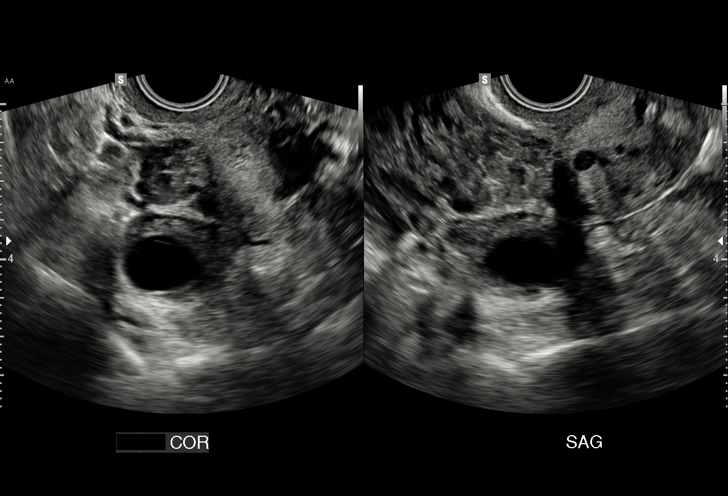
[im 26/29]
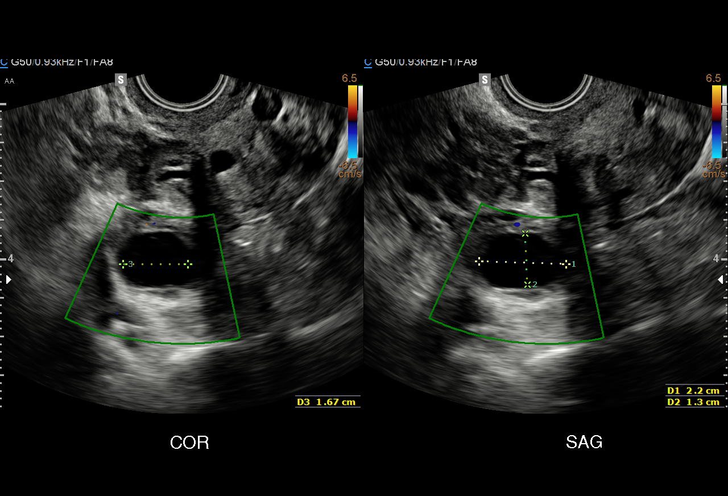
[im 29/29]
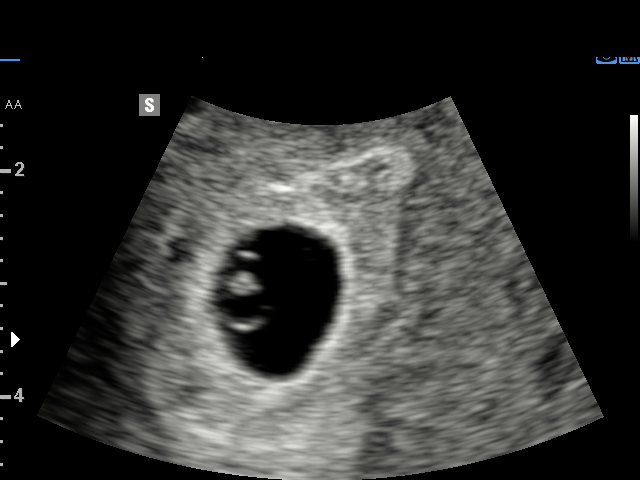

[15 of 28 positions shown; findings below may reference images not displayed]

FINDINGS: Intrauterine gestational sac: Single

Yolk sac:  Yes

Embryo:  Yes

Cardiac Activity: Yes

Heart Rate: 132  bpm

CRL:  25  mm   5 w   5 d                  US EDC: 05/16/2016

Maternal uterus/adnexae:

Subchorionic hemorrhage: None

Right ovary: Normal

Left ovary: Normal

Other :None

Free fluid:  None
IMPRESSION: 1. Single living intrauterine gestation with an estimated
gestational age of 5 weeks and 5 days.
2. No complications identified and no explanation for abdominal
pain.

## 2016-05-15 IMAGING — US US OB TRANSVAGINAL
1 series · 15 of 27 positions shown · non-contrast
Comparison: Pelvic ultrasound 09/19/2015

CLINICAL DATA: Patient with vaginal bleeding.

EXAM:
TRANSVAGINAL OB ULTRASOUND
TECHNIQUE: Transvaginal ultrasound was performed for complete evaluation of the
gestation as well as the maternal uterus, adnexal regions, and
pelvic cul-de-sac.

[Series 1: us ob transvaginal · 15 of 27 slices shown]
[im 1/27]
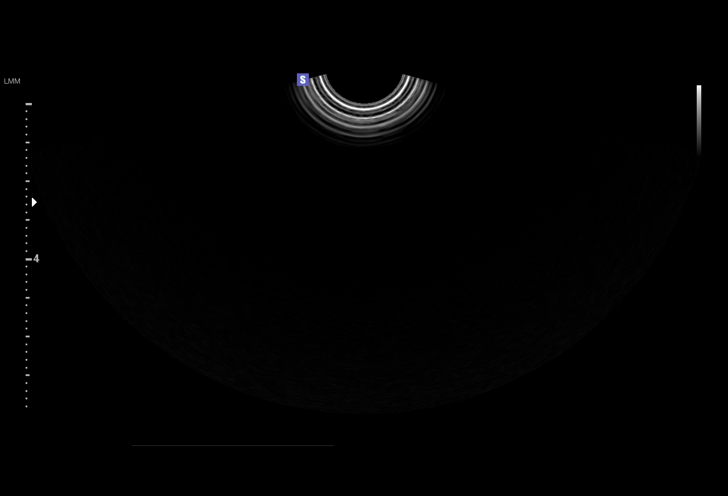
[im 3/27]
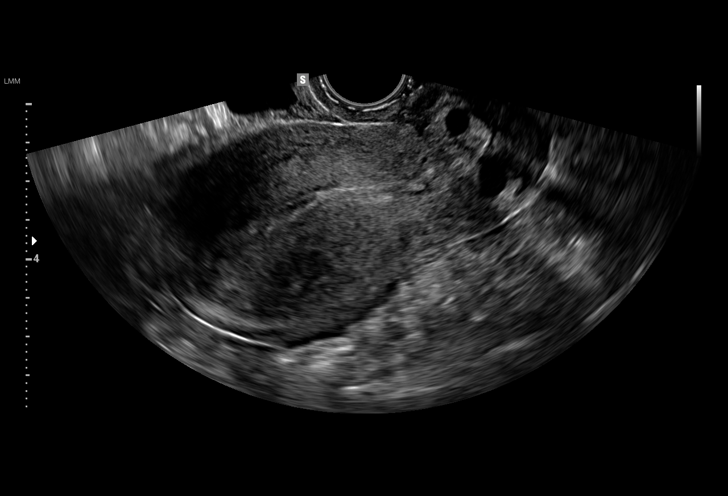
[im 5/27]
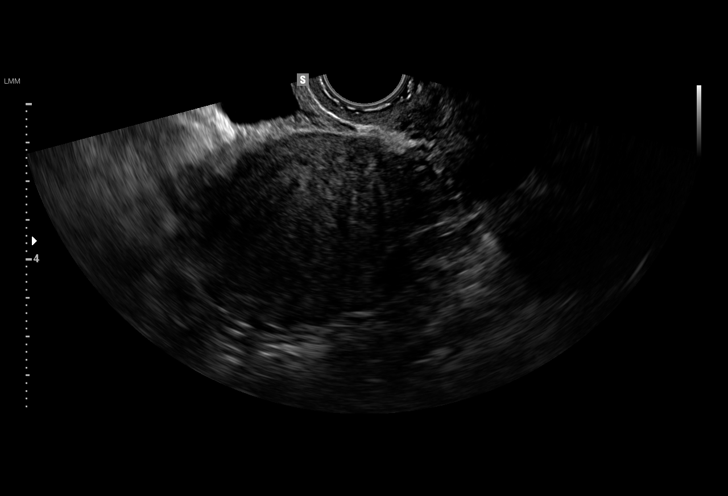
[im 7/27]
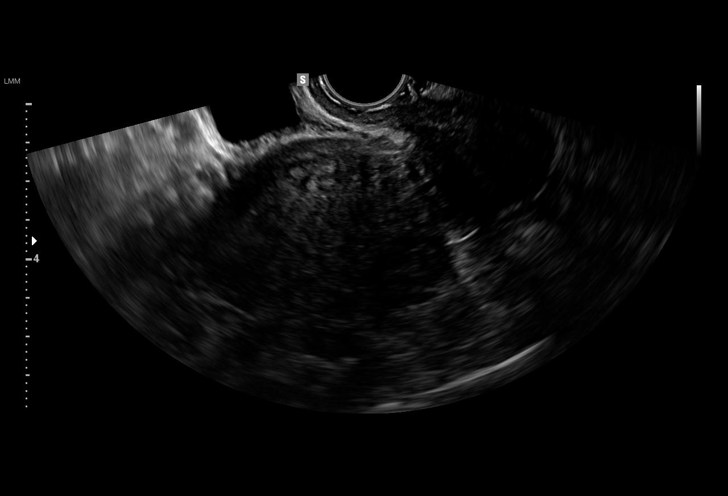
[im 9/27]
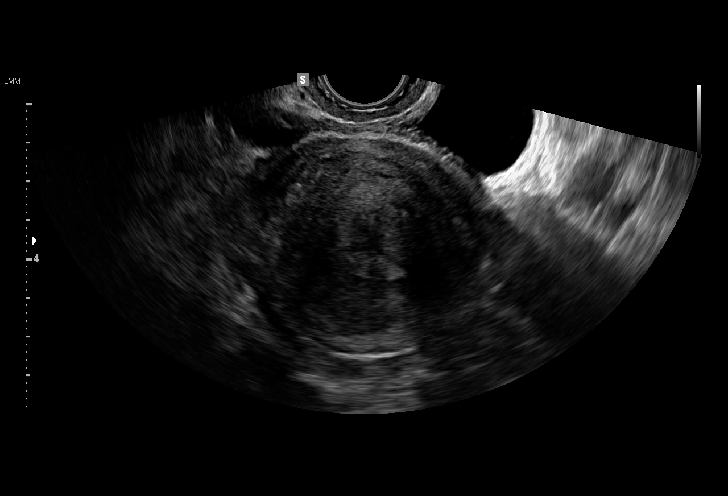
[im 10/27]
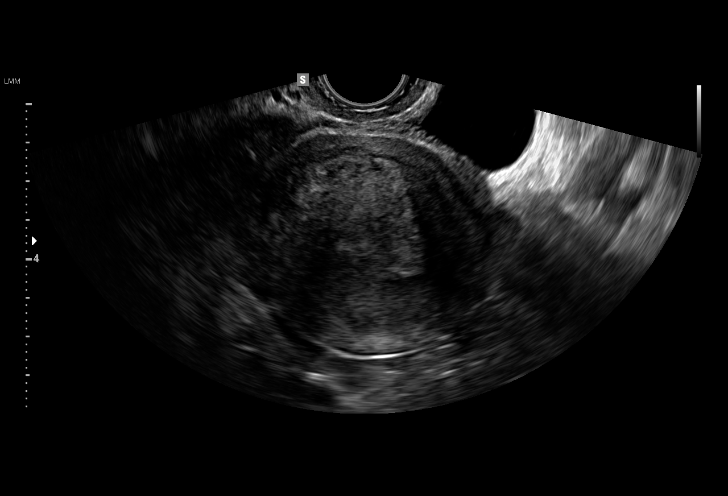
[im 12/27]
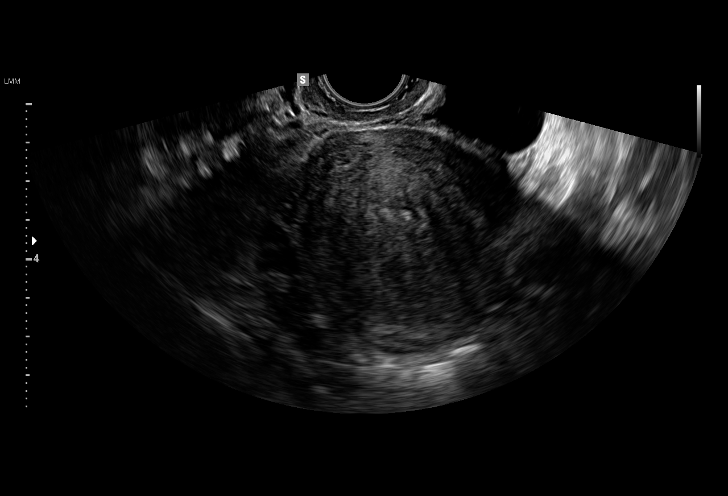
[im 14/27]
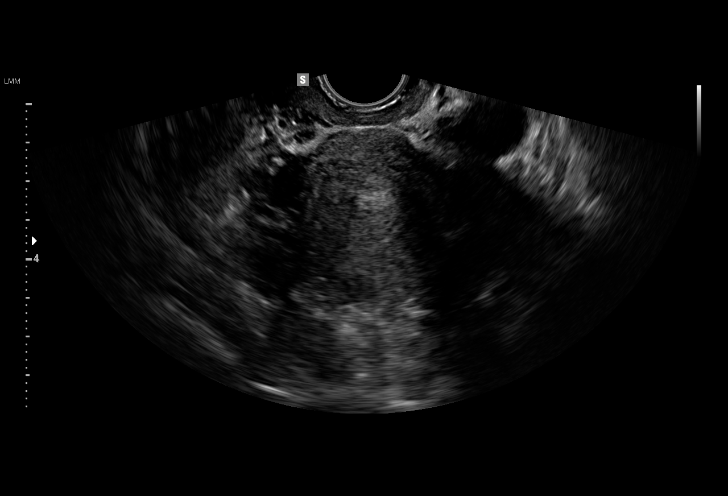
[im 16/27]
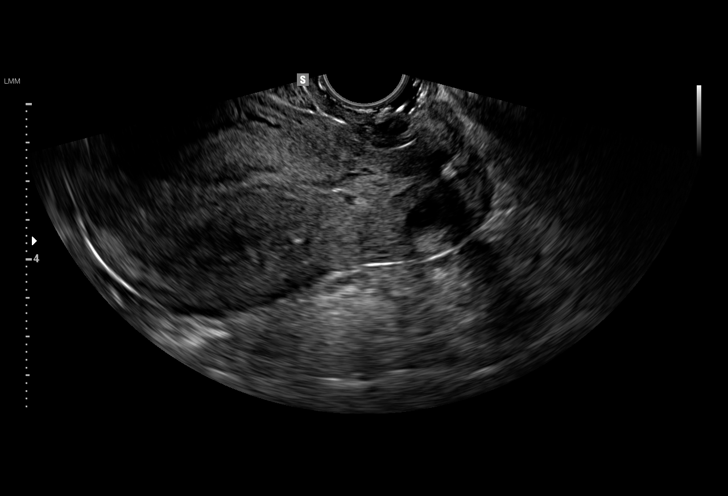
[im 18/27]
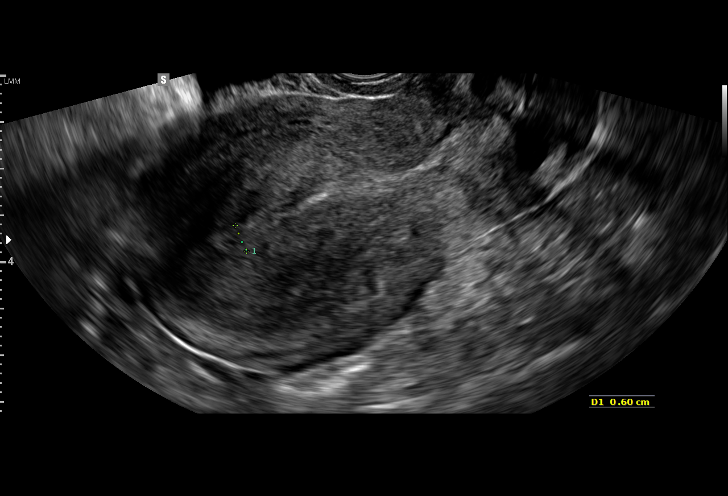
[im 19/27]
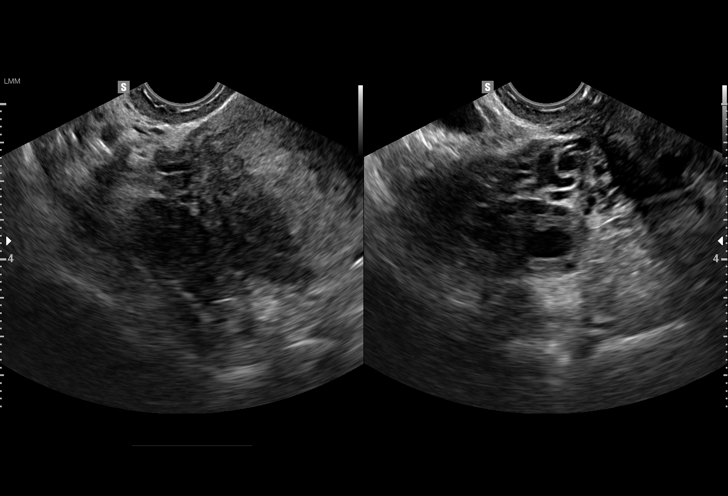
[im 21/27]
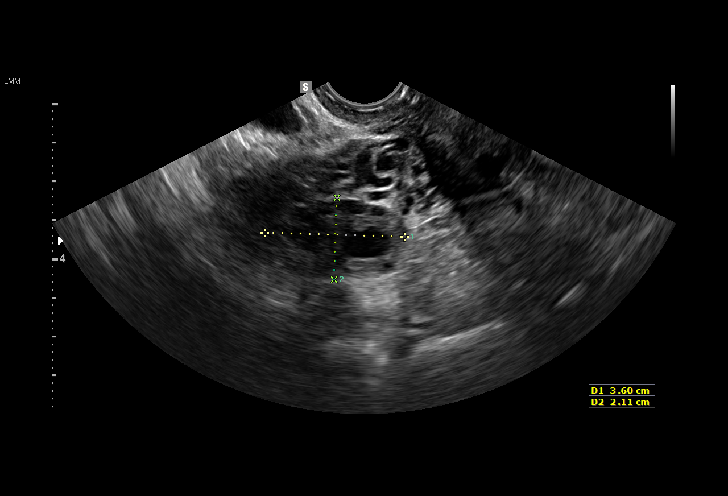
[im 23/27]
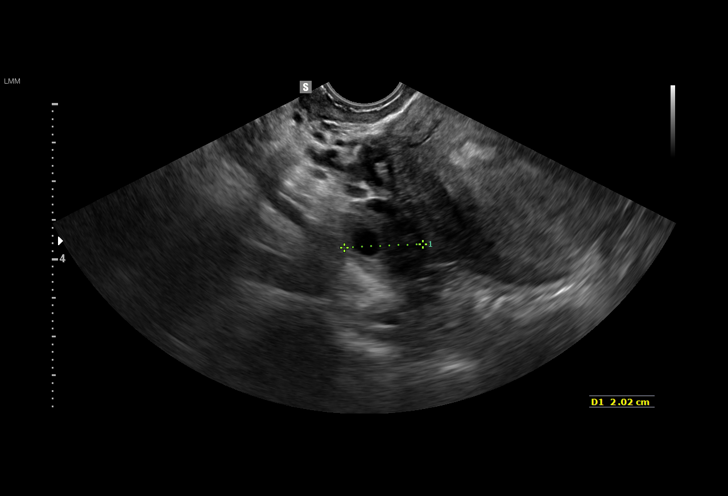
[im 25/27]
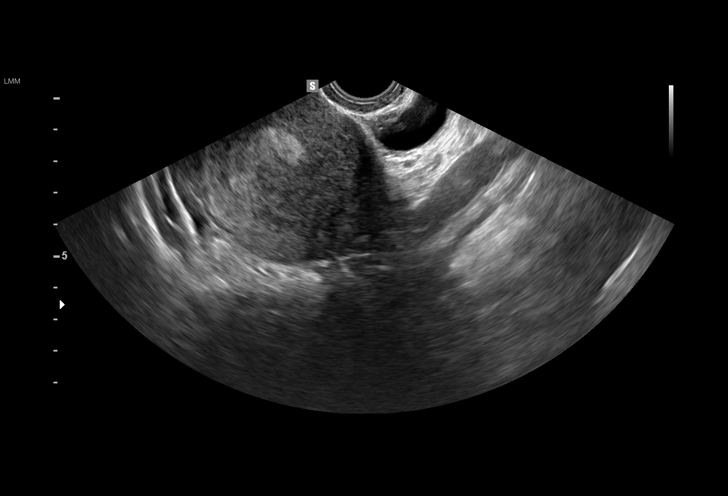
[im 27/27]
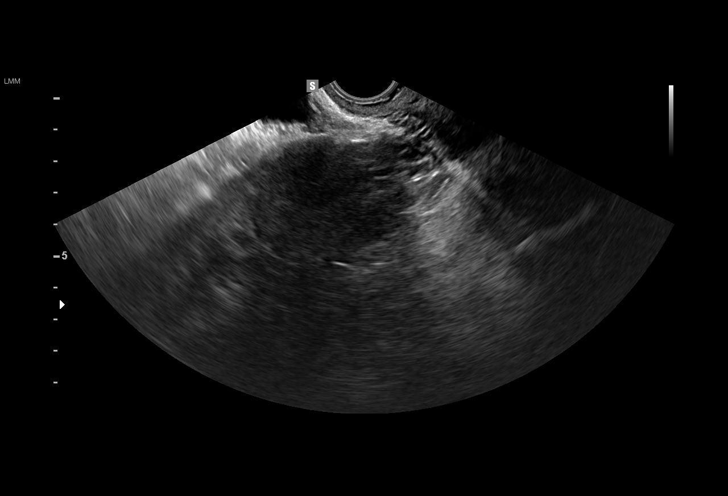

[15 of 27 positions shown; findings below may reference images not displayed]

FINDINGS: Intrauterine gestational sac: Not present

Yolk sac:  Not present

Embryo:  Not present

Cardiac Activity: Not present

Maternal uterus/adnexae: The right ovary is normal. Left ovary is
not visualized. No free fluid in the pelvis.
IMPRESSION: No intrauterine pregnancy is identified. No evidence to suggest
retained products of conception.

## 2016-07-24 ENCOUNTER — Encounter (HOSPITAL_COMMUNITY): Payer: Self-pay
